# Patient Record
Sex: Female | Born: 2000 | Race: White | Hispanic: No | Marital: Single | Smoking: Never smoker
Health system: Southern US, Community
[De-identification: ages and names within clinical notes are randomized; demographics above are authoritative.]

## PROBLEM LIST (undated history)

## (undated) DIAGNOSIS — Q6589 Other specified congenital deformities of hip: Secondary | ICD-10-CM

## (undated) HISTORY — DX: Other specified congenital deformities of hip: Q65.89

---
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
## 2020-02-04 IMAGING — DX DG SCOLIOSIS EVAL COMPLETE SPINE 4-5V
3 series · 3 of 3 positions shown · non-contrast
Comparison: None.

CLINICAL DATA: Scoliosis.

EXAM:
DG SCOLIOSIS EVAL COMPLETE SPINE 4-5V

[l-spine ap]
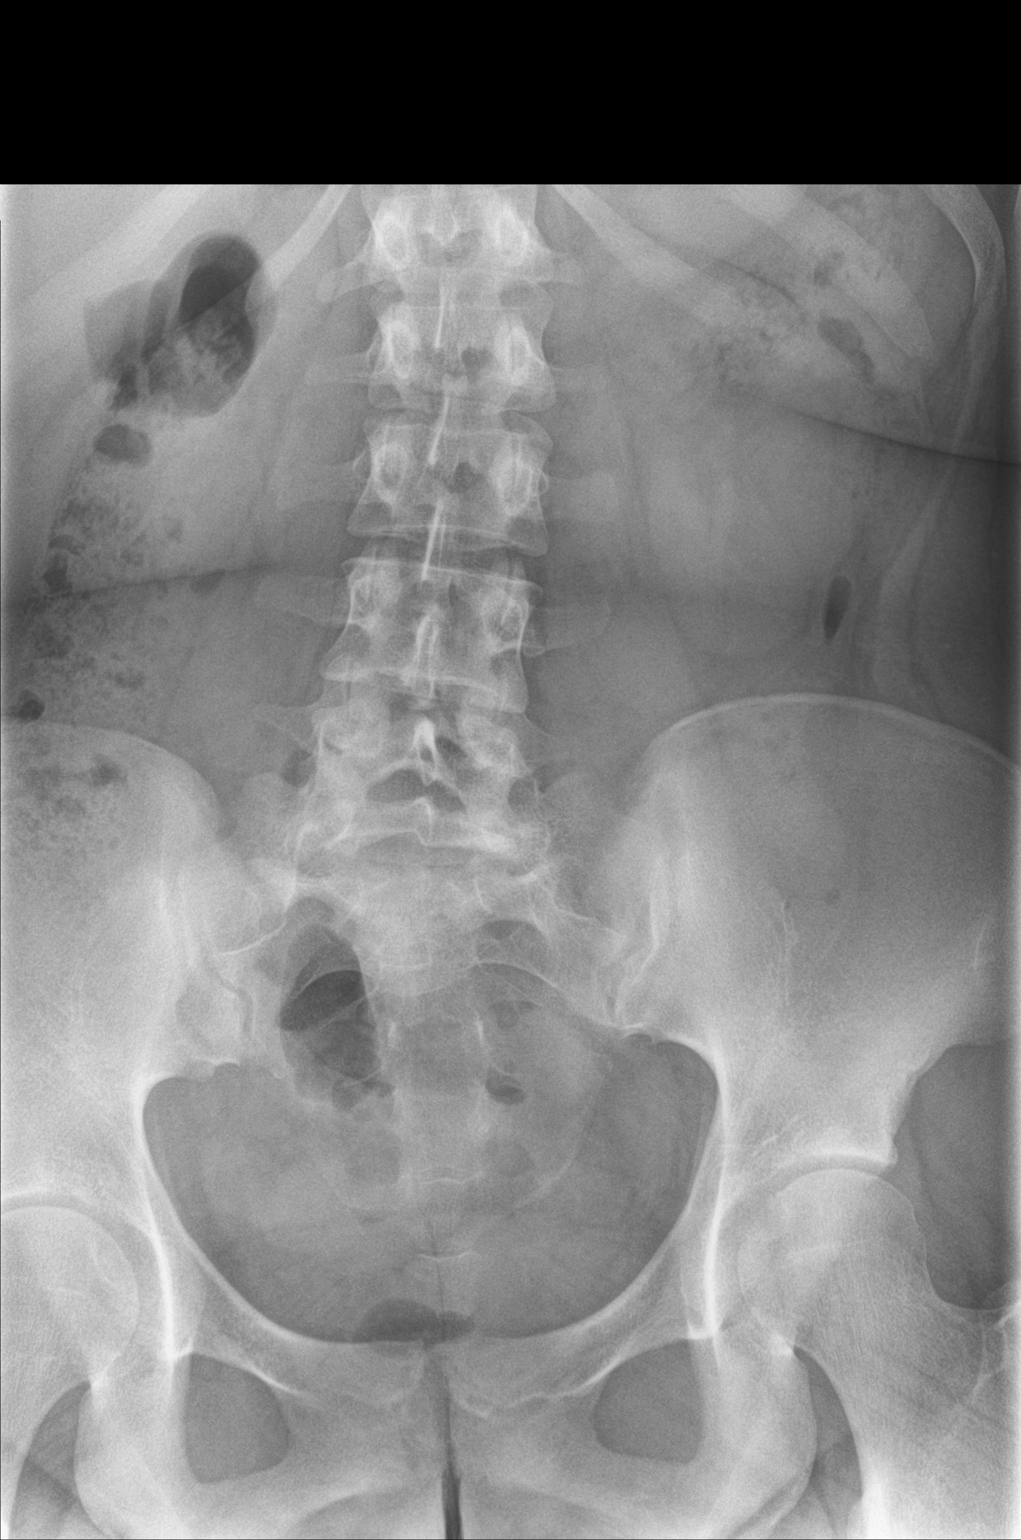

[t-spine ap (1 of 2)]
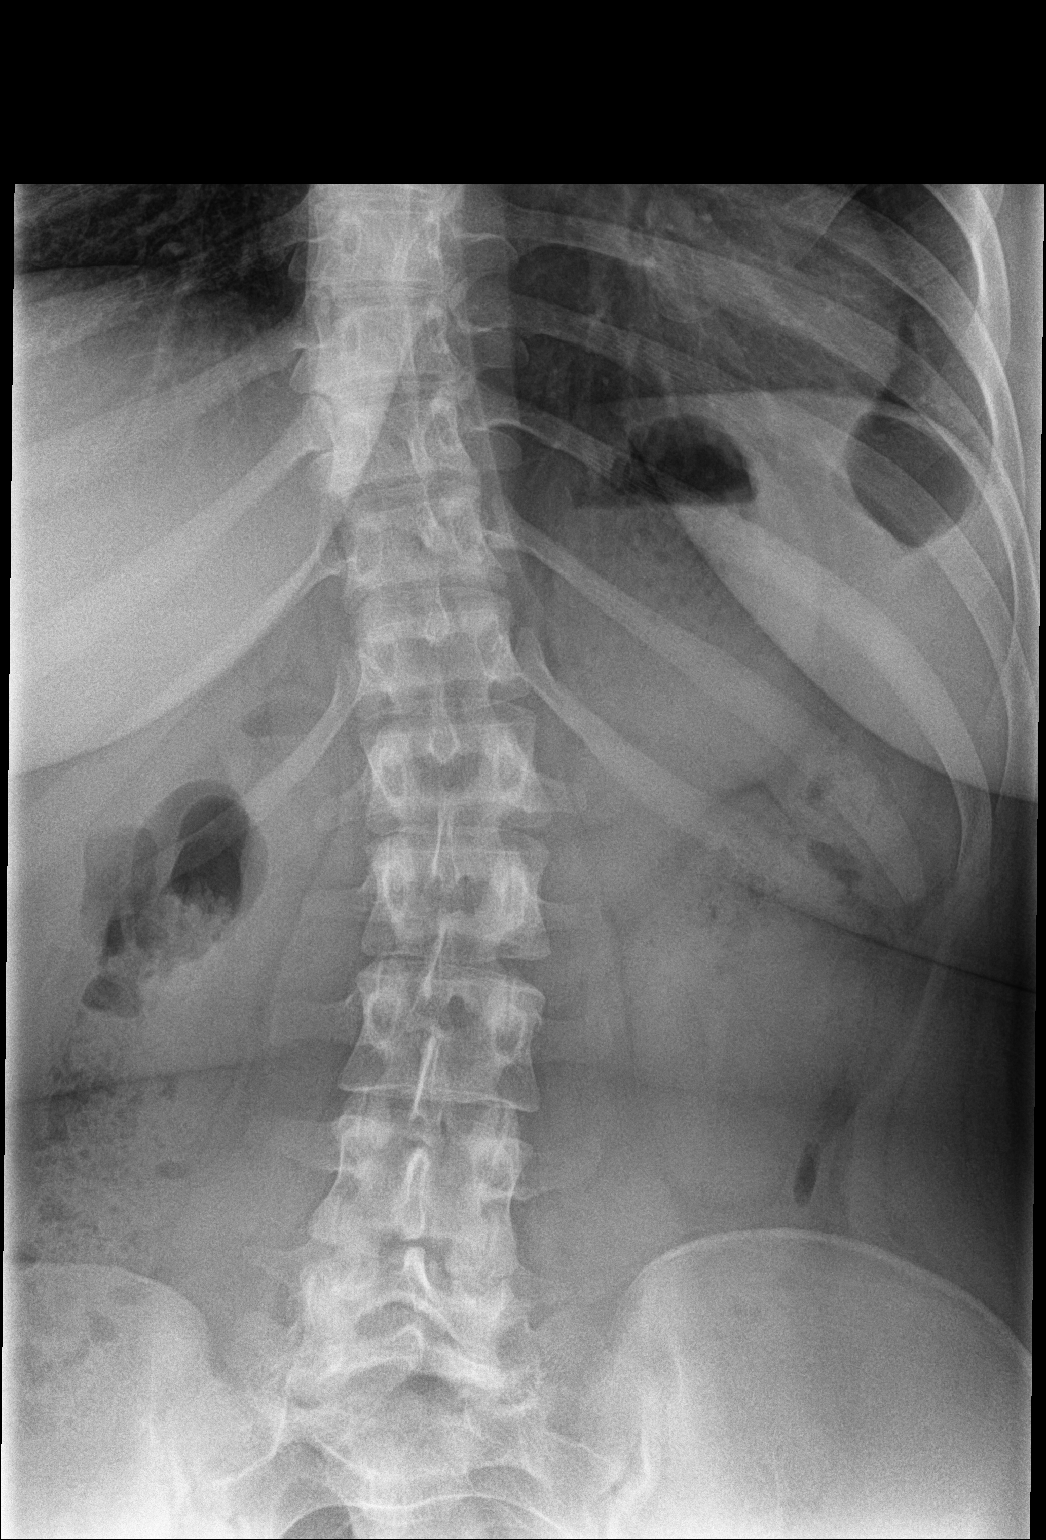

[t-spine ap (2 of 2)]
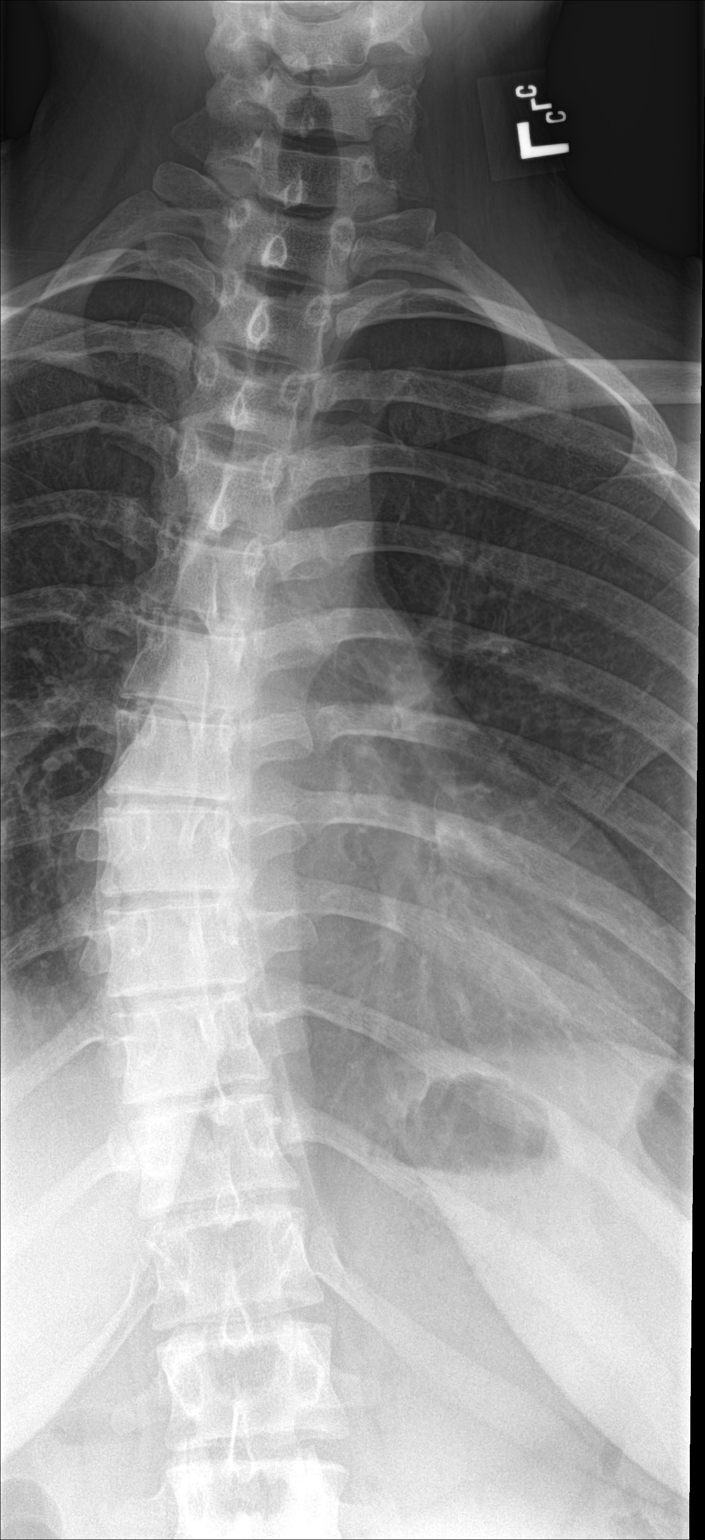

[3 of 3 positions shown; findings below may reference images not displayed]

FINDINGS: There is dextroscoliosis of the thoracic spine, centered at the T8
vertebral body level, measuring 18 degrees, not appreciably changed
compared to the chest x-ray of 08/07/2017.

There is also levoscoliosis of the lumbar spine, centered at the L2
vertebral body level, measuring 15 degrees.

No acute appearing osseous abnormality. Visualized paravertebral
soft tissues are unremarkable.
IMPRESSION: 1. Stable dextroscoliosis of the thoracic spine, centered at the T8
vertebral body level, measuring 18 degrees.
2. Levoscoliosis of the lumbar spine, centered at the L2 vertebral
body level, measuring 15 degrees.

## 2020-06-21 ENCOUNTER — Telehealth: Payer: Self-pay

## 2020-06-21 NOTE — Telephone Encounter (Signed)
Emailed shot record from [facility] to mother

## 2020-07-19 ENCOUNTER — Other Ambulatory Visit: Payer: PRIVATE HEALTH INSURANCE

## 2021-06-15 ENCOUNTER — Encounter: Payer: Self-pay | Admitting: Family Medicine

## 2021-06-15 ENCOUNTER — Ambulatory Visit: Payer: PRIVATE HEALTH INSURANCE | Admitting: Family Medicine

## 2021-09-13 ENCOUNTER — Ambulatory Visit: Payer: PRIVATE HEALTH INSURANCE | Admitting: Nurse Practitioner

## 2022-05-10 ENCOUNTER — Ambulatory Visit (INDEPENDENT_AMBULATORY_CARE_PROVIDER_SITE_OTHER): Payer: PRIVATE HEALTH INSURANCE

## 2022-05-10 ENCOUNTER — Encounter: Payer: Self-pay | Admitting: Family Medicine

## 2022-05-10 ENCOUNTER — Ambulatory Visit: Payer: PRIVATE HEALTH INSURANCE | Admitting: Family Medicine

## 2022-05-10 VITALS — BP 133/84 | HR 71 | Temp 98.0°F | Ht 64.0 in | Wt 268.0 lb

## 2022-05-10 DIAGNOSIS — M79672 Pain in left foot: Secondary | ICD-10-CM

## 2022-05-10 NOTE — Progress Notes (Signed)
BP 133/84   Pulse 71   Temp 98 F (36.7 C)   Ht 5\' 4"  (1.626 m)   Wt 268 lb (121.6 kg)   SpO2 99%   BMI 46.00 kg/m    Subjective:   Patient ID: Courtney Meyer, female    DOB: 09-Jul-2001, 21 y.o.   MRN: [id number]  HPI: Courtney Meyer is a 21 y.o. female presenting on 05/10/2022 for Foot Pain (Left, h/x of fractures)   HPI Left foot pain Patient is coming in today.  She says 3 days ago she dropped a sheet pressed on her left foot on top of it and has some bruising and swelling just proximal to her great toe on left foot.  It hurts when she walks on it.  She says she has broken this foot in the past for but it has not swollen and bruised.  She said over the weekend the pain was just not getting better.  She does have sensation and is able to move her toes some.  Relevant past medical, surgical, family and social history reviewed and updated as indicated. Interim medical history since our last visit reviewed. Allergies and medications reviewed and updated.  Review of Systems  Constitutional:  Negative for chills and fever.  HENT:  Negative for ear pain.   Eyes:  Negative for visual disturbance.  Respiratory:  Negative for chest tightness and shortness of breath.   Cardiovascular:  Negative for chest pain and leg swelling.  Musculoskeletal:  Positive for arthralgias, gait problem and joint swelling. Negative for back pain and myalgias.  Skin:  Negative for color change and rash.  Neurological:  Negative for light-headedness and headaches.  Psychiatric/Behavioral:  Negative for agitation and behavioral problems.   All other systems reviewed and are negative.   Per HPI unless specifically indicated above   Allergies as of 05/10/2022   No Known Allergies      Medication List        Accurate as of May 10, 2022 10:59 AM. If you have any questions, ask your nurse or doctor.          loratadine 10 MG tablet Commonly known as: CLARITIN Take 10 mg by mouth  daily.         Objective:   BP 133/84   Pulse 71   Temp 98 F (36.7 C)   Ht 5\' 4"  (1.626 m)   Wt 268 lb (121.6 kg)   SpO2 99%   BMI 46.00 kg/m   Wt Readings from Last 3 Encounters:  05/10/22 268 lb (121.6 kg)  08/14/18 208 lb 9.6 oz (94.6 kg) (98 %, Z= 2.11)*  11/22/17 206 lb 9.6 oz (93.7 kg) (98 %, Z= 2.12)*   * Growth percentiles are based on CDC (Girls, 2-20 Years) data.    Physical Exam Vitals and nursing note reviewed.  Constitutional:      General: She is not in acute distress.    Appearance: She is well-developed. She is not diaphoretic.  Eyes:     Conjunctiva/sclera: Conjunctivae normal.  Musculoskeletal:        General: Normal range of motion.     Left foot: Swelling and tenderness present. No deformity.  Skin:    General: Skin is warm and dry.     Findings: Bruising present. No rash.  Neurological:     Mental Status: She is alert and oriented to person, place, and time.     Coordination: Coordination normal.  Psychiatric:  Behavior: Behavior normal.     Left foot x-ray, no signs of acute bony abnormality noted.  Await final read from radiologist.   Assessment & Plan:   Problem List Items Addressed This Visit   None Visit Diagnoses     Left foot pain    -  Primary   Relevant Orders   DG Foot Complete Left      Recommend to wear the boot for a week or wait for radiology report and if no sign of fracture then at that point she can start coming out of the boot returning to normal function.  If it is fractured I would recommend for her to wear a boot for at least 3 weeks  Do note for work giving her the next couple days off and then will reevaluate from there.  Follow up plan: Return if symptoms worsen or fail to improve.  Counseling provided for all of the vaccine components Orders Placed This Encounter  Procedures   DG Foot Complete Left    Arville Care, MD [facility] 05/10/2022, 10:59 AM

## 2022-10-02 ENCOUNTER — Encounter: Payer: Self-pay | Admitting: Family Medicine

## 2022-10-02 ENCOUNTER — Telehealth (INDEPENDENT_AMBULATORY_CARE_PROVIDER_SITE_OTHER): Payer: PRIVATE HEALTH INSURANCE | Admitting: Family Medicine

## 2022-10-02 DIAGNOSIS — M71122 Other infective bursitis, left elbow: Secondary | ICD-10-CM

## 2022-10-02 MED ORDER — DOXYCYCLINE HYCLATE 100 MG PO TABS: 100.0000 mg | ORAL_TABLET | Freq: Two times a day (BID) | ORAL | 0 refills | Status: AC

## 2022-10-02 NOTE — Progress Notes (Signed)
MyChart Video visit  Subjective: CC:?infected elbow PCP: Claretta Fraise, MD [id number] Courtney Meyer is a 22 y.o. female. Patient provides verbal consent for consult held via video.  Due to COVID-19 pandemic this visit was conducted virtually. This visit type was conducted due to national recommendations for restrictions regarding the COVID-19 Pandemic (e.g. social distancing, sheltering in place) in an effort to limit this patient's exposure and mitigate transmission in our community. All issues noted in this document were discussed and addressed.  A physical exam was not performed with this format.   Location of patient: work Location of provider: [facility] Others present for call: none  1. Elbow swelling Patient reports that she works at [employer] and had a provider take a look at it and there was concern for soft tissue infection.  She reports yesterday she was able to get some purulent material out of the spot on the tip of her left elbow.  She has pain with bending the elbow but only at the tip of the elbow.  She denies any difficulty actually moving the elbow.  No gross joint swelling, erythema or warmth.  She has not had any fevers.  Patient's last menstrual period was 10/02/2022.   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  She is able to extend and flex elbow without difficulty  Assessment/ Plan: 22 y.o. female   Infected olecranon bursa, left - Plan: doxycycline (VIBRA-TABS) 100 MG tablet  Appears to be an infected olecranon bursa.  Discussed home care instructions.  Start doxycycline given purulent material from lesion.  We discussed red flag signs and symptoms that would be suggestive of  septic joint or complicated soft tissue infection.  She understood reasons for return.  She will follow-up as needed  Start time: 12:55 (text sent) End time: 1:00pm  Total time spent on patient care (including video visit/ documentation): 5 minutes  [place], [place] [phone]

## 2023-01-04 ENCOUNTER — Encounter: Payer: Self-pay | Admitting: Family Medicine

## 2023-01-04 ENCOUNTER — Ambulatory Visit (INDEPENDENT_AMBULATORY_CARE_PROVIDER_SITE_OTHER): Payer: PRIVATE HEALTH INSURANCE | Admitting: Family Medicine

## 2023-01-04 VITALS — BP 133/86 | HR 100 | Temp 98.4°F | Ht 64.0 in | Wt 273.4 lb

## 2023-01-04 DIAGNOSIS — Z0000 Encounter for general adult medical examination without abnormal findings: Secondary | ICD-10-CM

## 2023-01-04 DIAGNOSIS — Z23 Encounter for immunization: Secondary | ICD-10-CM | POA: Diagnosis not present

## 2023-01-04 DIAGNOSIS — Z020 Encounter for examination for admission to educational institution: Secondary | ICD-10-CM

## 2023-01-04 NOTE — Progress Notes (Signed)
Subjective:  Patient ID: Courtney Meyer, female    DOB: [birth year]  Age: 22 y.o. MRN: [id number]  CC: Annual Exam   HPI [place] presents for college admission physical. 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     05/10/2022   10:45 AM 08/14/2018    2:26 PM 11/22/2017    4:07 PM  Depression screen PHQ 2/9  Decreased Interest 0 0 0  Down, Depressed, Hopeless 0 0 0  PHQ - 2 Score 0 0 0  Altered sleeping 0    Tired, decreased energy 0    Change in appetite 0    Feeling bad or failure about yourself  0    Trouble concentrating 0    Moving slowly or fidgety/restless 0    Suicidal thoughts 0    PHQ-9 Score 0    Difficult doing work/chores Not difficult at all      History Courtney has a past medical history of Hip dysplasia (04[birth year]27[birth year]2001).   She has no past surgical history on file.   Her family history is not on file.She reports that she has never smoked. She has never used smokeless tobacco. She reports that she does not drink alcohol and does not use drugs.    ROS Review of Systems  Constitutional: Negative.   HENT:  Negative for congestion.   Eyes:  Negative for visual disturbance.  Respiratory:  Negative for shortness of breath.   Cardiovascular:  Negative for chest pain.  Gastrointestinal:  Negative for abdominal pain, constipation, diarrhea, nausea and vomiting.  Genitourinary:  Negative for difficulty urinating.  Musculoskeletal:  Negative for arthralgias and myalgias.  Neurological:  Negative for headaches.  Psychiatric/Behavioral:  Negative for sleep disturbance.     Objective:  BP 133/86   Pulse 100   Temp 98.4 F (36.9 C)   Ht 5\' 4"  (1.626 m)   Wt 273 lb 6.4 oz (124 kg)   SpO2 98%   BMI 46.93 kg/m   BP Readings from Last 3 Encounters:  01/04/23 133/86  05/10/22 133/84  08/14/18 127/78 (95 %, Z = 1.64 /  92 %, Z = 1.41)*   *BP percentiles are based on the 2017 AAP Clinical Practice Guideline for girls    Wt Readings from Last 3 Encounters:   01/04/23 273 lb 6.4 oz (124 kg)  05/10/22 268 lb (121.6 kg)  08/14/18 208 lb 9.6 oz (94.6 kg) (98 %, Z= 2.11)*   * Growth percentiles are based on CDC (Girls, 2-20 Years) data.     Physical Exam Constitutional:      General: She is not in acute distress.    Appearance: She is well-developed.  HENT:     Head: Normocephalic and atraumatic.  Eyes:     Conjunctiva/sclera: Conjunctivae normal.     Pupils: Pupils are equal, round, and reactive to light.  Neck:     Thyroid: No thyromegaly.  Cardiovascular:     Rate and Rhythm: Normal rate and regular rhythm.     Heart sounds: Normal heart sounds. No murmur heard. Pulmonary:     Effort: Pulmonary effort is normal. No respiratory distress.     Breath sounds: Normal breath sounds. No wheezing or rales.  Abdominal:     General: Bowel sounds are normal. There is no distension.     Palpations: Abdomen is soft.     Tenderness: There is no abdominal tenderness.  Musculoskeletal:        General: Normal range of motion.     Cervical back:  Normal range of motion and neck supple.  Lymphadenopathy:     Cervical: No cervical adenopathy.  Skin:    General: Skin is warm and dry.  Neurological:     Mental Status: She is alert and oriented to person, place, and time.  Psychiatric:        Behavior: Behavior normal.        Thought Content: Thought content normal.        Judgment: Judgment normal.       Assessment & Plan:   Courtney was seen today for annual exam.  Diagnoses and all orders for this visit:  School physical exam -     Hepatitis B surface antibody,quantitative -     Cancel: QuantiFERON-TB Gold Plus -     Measles/Mumps/Rubella Immunity -     QuantiFERON-TB Gold Plus  Need for Tdap vaccination -     Tdap vaccine greater than or equal to 7yo IM       I have discontinued Courtney Meyer's loratadine.  Allergies as of 01/04/2023   No Known Allergies      Medication List        Accurate as of January 04, 2023  11:59 PM. If you have any questions, ask your nurse or doctor.          STOP taking these medications    loratadine 10 MG tablet Commonly known as: CLARITIN Stopped by: Mechele Claude, MD         Follow-up: Return in about 1 year (around 01/04/2024).  Mechele Claude, M.D.

## 2023-01-05 LAB — MEASLES/MUMPS/RUBELLA IMMUNITY
MUMPS ABS, IGG: 17.2 AU/mL (ref >10.9)
RUBEOLA AB, IGG: 37.8 AU/mL (ref >16.4)
Rubella Antibodies, IGG: 2.23 index (ref >0.99)

## 2023-01-05 LAB — HEPATITIS B SURFACE ANTIBODY, QUANTITATIVE: Hepatitis B Surf Ab Quant: 58.4 m[IU]/mL (ref >9.9)

## 2023-01-07 ENCOUNTER — Encounter: Payer: Self-pay | Admitting: Family Medicine

## 2023-01-09 ENCOUNTER — Telehealth: Payer: Self-pay | Admitting: Family Medicine

## 2023-01-09 NOTE — Telephone Encounter (Signed)
Spoke with patient and advised waiting on TB result to have Dr Darlyn Read sign off

## 2023-01-09 NOTE — Telephone Encounter (Signed)
Pt needs to pick up her copy of the physical form that she brought in with her when she came for her appt last week (specifically pages 9 and 10). Also says she needs her TB results because its not in her chart either.   Please advise and call patient.

## 2023-01-11 LAB — QUANTIFERON-TB GOLD PLUS
QuantiFERON Mitogen Value: >10 IU/mL
QuantiFERON Nil Value: 0.08 IU/mL
QuantiFERON TB1 Ag Value: 0.15 IU/mL
QuantiFERON TB2 Ag Value: 0.14 IU/mL
QuantiFERON-TB Gold Plus: NEGATIVE

## 2023-03-07 ENCOUNTER — Telehealth: Payer: Self-pay | Admitting: Family Medicine

## 2023-03-07 NOTE — Telephone Encounter (Signed)
Pt. Needs to be seen for this. Thanks, WS 

## 2023-03-07 NOTE — Telephone Encounter (Signed)
Spoke with pt- appt made

## 2023-03-07 NOTE — Telephone Encounter (Signed)
Pt called requesting a note for school. 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

## 2023-03-19 ENCOUNTER — Ambulatory Visit: Payer: PRIVATE HEALTH INSURANCE | Admitting: Family Medicine

## 2023-03-26 ENCOUNTER — Ambulatory Visit: Payer: PRIVATE HEALTH INSURANCE | Admitting: Family Medicine

## 2023-12-20 ENCOUNTER — Telehealth: Payer: Self-pay

## 2023-12-20 ENCOUNTER — Other Ambulatory Visit: Payer: Self-pay

## 2023-12-20 DIAGNOSIS — Z111 Encounter for screening for respiratory tuberculosis: Secondary | ICD-10-CM

## 2023-12-20 NOTE — Telephone Encounter (Signed)
 Copied from CRM [id number]. 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 Preferably by Thursday of next week. No order in. Had one last year. Required annually.

## 2023-12-20 NOTE — Telephone Encounter (Signed)
 Returned patient call to schedule lab appointment, left message to call back.

## 2023-12-20 NOTE — Telephone Encounter (Signed)
Lab appointment scheduled and labs ordered.

## 2023-12-20 NOTE — Telephone Encounter (Signed)
 Pt called back to schedule, missed call.

## 2023-12-21 ENCOUNTER — Other Ambulatory Visit: Payer: PRIVATE HEALTH INSURANCE

## 2023-12-21 DIAGNOSIS — Z111 Encounter for screening for respiratory tuberculosis: Secondary | ICD-10-CM

## 2023-12-27 ENCOUNTER — Ambulatory Visit: Payer: Self-pay | Admitting: Family Medicine

## 2023-12-27 LAB — QUANTIFERON-TB GOLD PLUS
QuantiFERON Mitogen Value: >10 [IU]/mL
QuantiFERON Nil Value: 0.17 [IU]/mL
QuantiFERON TB1 Ag Value: 0.2 [IU]/mL
QuantiFERON TB2 Ag Value: 0.25 [IU]/mL
QuantiFERON-TB Gold Plus: NEGATIVE
